# Patient Record
Sex: Female | Born: 1945 | Race: Black or African American | Hispanic: No | Marital: Single | Smoking: Never smoker
Health system: Southern US, Community
[De-identification: ages and names within clinical notes are randomized; demographics above are authoritative.]

## PROBLEM LIST (undated history)

## (undated) ENCOUNTER — Emergency Department (HOSPITAL_COMMUNITY): Payer: Self-pay | Source: Home / Self Care

## (undated) DIAGNOSIS — Z9001 Acquired absence of eye: Secondary | ICD-10-CM

## (undated) DIAGNOSIS — H547 Unspecified visual loss: Secondary | ICD-10-CM

## (undated) DIAGNOSIS — G35 Multiple sclerosis: Secondary | ICD-10-CM

## (undated) DIAGNOSIS — E785 Hyperlipidemia, unspecified: Secondary | ICD-10-CM

## (undated) HISTORY — PX: NO PAST SURGERIES: SHX2092

## (undated) HISTORY — PX: EYE SURGERY: SHX253

---

## 1965-01-10 DIAGNOSIS — H547 Unspecified visual loss: Secondary | ICD-10-CM

## 1965-01-10 DIAGNOSIS — Z9001 Acquired absence of eye: Secondary | ICD-10-CM

## 1965-01-10 HISTORY — DX: Unspecified visual loss: H54.7

## 1965-01-10 HISTORY — DX: Acquired absence of eye: Z90.01

## 2007-03-07 ENCOUNTER — Ambulatory Visit: Payer: Self-pay | Admitting: Internal Medicine

## 2008-02-01 ENCOUNTER — Ambulatory Visit: Payer: Self-pay | Admitting: Neurology

## 2008-06-30 ENCOUNTER — Ambulatory Visit: Payer: Self-pay | Admitting: Internal Medicine

## 2008-07-26 ENCOUNTER — Emergency Department: Payer: Self-pay | Admitting: Emergency Medicine

## 2010-05-07 ENCOUNTER — Ambulatory Visit: Payer: Self-pay | Admitting: Internal Medicine

## 2010-06-29 ENCOUNTER — Ambulatory Visit: Payer: Self-pay | Admitting: Internal Medicine

## 2013-05-28 ENCOUNTER — Ambulatory Visit: Payer: Self-pay

## 2015-08-05 ENCOUNTER — Other Ambulatory Visit: Payer: Self-pay | Admitting: Family Medicine

## 2015-08-05 DIAGNOSIS — Z780 Asymptomatic menopausal state: Secondary | ICD-10-CM

## 2016-06-14 ENCOUNTER — Ambulatory Visit
Admission: EM | Admit: 2016-06-14 | Discharge: 2016-06-14 | Disposition: A | Discharge status: Home / Self Care | Payer: Medicare Other | Attending: Family Medicine | Admitting: Family Medicine

## 2016-06-14 DIAGNOSIS — R234 Changes in skin texture: Secondary | ICD-10-CM

## 2016-06-14 HISTORY — DX: Hyperlipidemia, unspecified: E78.5

## 2016-06-14 NOTE — ED Provider Notes (Signed)
[facility]    CSN: [id number] Arrival date & time: 06/14/16  1915     History   Chief Complaint Chief Complaint  Patient presents with  . Mass    HPI Kelly Moss is a 71 y.o. female.   71 yo female with a c/o a "bump" to the back of the head/scalp and concerned it might be an embedded tick. Denies any injuries.   The history is provided by the patient.    Past Medical History:  Diagnosis Date  . Hyperlipidemia     There are no active problems to display for this patient.   Past Surgical History:  Procedure Laterality Date  . NO PAST SURGERIES      OB History    No data available       Home Medications    Prior to Admission medications   Medication Sig Start Date End Date Taking? Authorizing Provider  rosuvastatin (CRESTOR) 10 MG tablet Take 10 mg by mouth daily.   Yes [provider]    Family History History reviewed. No pertinent family history.  Social History Social History  Substance Use Topics  . Smoking status: Never Smoker  . Smokeless tobacco: Never Used  . Alcohol use Yes     Comment: socially     Allergies   Patient has no known allergies.   Review of Systems Review of Systems   Physical Exam Triage Vital Signs ED Triage Vitals  Enc Vitals Group     BP 06/14/16 2040 (!) 133/95     Pulse Rate 06/14/16 2040 60     Resp 06/14/16 2040 16     Temp 06/14/16 2040 98.1 F (36.7 C)     Temp Source 06/14/16 2040 Oral     SpO2 06/14/16 2040 100 %     Weight 06/14/16 2037 137 lb (62.1 kg)     Height 06/14/16 2037 5\' 4"  (1.626 m)     Head Circumference --      Peak Flow --      Pain Score --      Pain Loc --      Pain Edu? --      Excl. in GC? --    No data found.   Updated Vital Signs BP (!) 133/95 (BP Location: Left Arm)   Pulse 60   Temp 98.1 F (36.7 C) (Oral)   Resp 16   Ht 5\' 4"  (1.626 m)   Wt 137 lb (62.1 kg)   SpO2 100%   BMI 23.52 kg/m   Visual Acuity Right Eye Distance:     Left Eye Distance:   Bilateral Distance:    Right Eye Near:   Left Eye Near:    Bilateral Near:     Physical Exam  Constitutional: She appears well-developed and well-nourished. No distress.  HENT:  Head:    Occipital scalp area with 4mm scab; no drainage; no foreign body   Skin: She is not diaphoretic.  Nursing note and vitals reviewed.    UC Treatments / Results  Labs (all labs ordered are listed, but only abnormal results are displayed) Labs Reviewed - No data to display  EKG  EKG Interpretation None       Radiology No results found.  Procedures Procedures (including critical care time)  Medications Ordered in UC Medications - No data to display   Initial Impression / Assessment and Plan / UC Course  I have reviewed the triage vital signs and the  nursing notes.  Pertinent labs & imaging results that were available during my care of the patient were reviewed by me and considered in my medical decision making (see chart for details).       Final Clinical Impressions(s) / UC Diagnoses   Final diagnoses:  Scab  (on occipital scalp)  New Prescriptions Discharge Medication List as of 06/14/2016  9:06 PM     1. diagnosis reviewed with patient; scab removed      2. Follow-up prn   Payton Mccallum, MD 06/14/16 2118

## 2016-06-14 NOTE — ED Triage Notes (Signed)
Patient complains of possible tick/ bump in head. Patient states that she has been working in her garden today and noticed the area and has been concerned about a tick.

## 2016-09-01 ENCOUNTER — Other Ambulatory Visit: Payer: Self-pay | Admitting: Family Medicine

## 2016-09-01 DIAGNOSIS — Z780 Asymptomatic menopausal state: Secondary | ICD-10-CM

## 2016-12-26 ENCOUNTER — Encounter: Admission: RE | Payer: Self-pay | Source: Ambulatory Visit

## 2016-12-26 ENCOUNTER — Ambulatory Visit: Admission: RE | Admit: 2016-12-26 | Payer: Medicare Other | Source: Ambulatory Visit | Admitting: Gastroenterology

## 2016-12-26 SURGERY — COLONOSCOPY WITH PROPOFOL
Anesthesia: General

## 2017-03-01 ENCOUNTER — Encounter: Payer: Self-pay | Admitting: *Deleted

## 2017-03-02 ENCOUNTER — Encounter
Admission: RE | Disposition: A | Discharge status: Home / Self Care | Payer: Self-pay | Source: Ambulatory Visit | Attending: Gastroenterology

## 2017-03-02 ENCOUNTER — Ambulatory Visit
Admission: RE | Admit: 2017-03-02 | Discharge: 2017-03-02 | Disposition: A | Discharge status: Home / Self Care | Payer: Medicare HMO | Source: Ambulatory Visit | Attending: Gastroenterology | Admitting: Gastroenterology

## 2017-03-02 ENCOUNTER — Ambulatory Visit: Payer: Medicare HMO | Admitting: Certified Registered"

## 2017-03-02 DIAGNOSIS — R202 Paresthesia of skin: Secondary | ICD-10-CM | POA: Insufficient documentation

## 2017-03-02 DIAGNOSIS — K641 Second degree hemorrhoids: Secondary | ICD-10-CM | POA: Insufficient documentation

## 2017-03-02 DIAGNOSIS — E785 Hyperlipidemia, unspecified: Secondary | ICD-10-CM | POA: Insufficient documentation

## 2017-03-02 DIAGNOSIS — Z79899 Other long term (current) drug therapy: Secondary | ICD-10-CM | POA: Diagnosis not present

## 2017-03-02 DIAGNOSIS — G35 Multiple sclerosis: Secondary | ICD-10-CM | POA: Insufficient documentation

## 2017-03-02 DIAGNOSIS — Z1211 Encounter for screening for malignant neoplasm of colon: Secondary | ICD-10-CM | POA: Diagnosis not present

## 2017-03-02 DIAGNOSIS — Z888 Allergy status to other drugs, medicaments and biological substances status: Secondary | ICD-10-CM | POA: Insufficient documentation

## 2017-03-02 DIAGNOSIS — Z7982 Long term (current) use of aspirin: Secondary | ICD-10-CM | POA: Diagnosis not present

## 2017-03-02 DIAGNOSIS — H547 Unspecified visual loss: Secondary | ICD-10-CM | POA: Diagnosis not present

## 2017-03-02 DIAGNOSIS — Q438 Other specified congenital malformations of intestine: Secondary | ICD-10-CM | POA: Diagnosis not present

## 2017-03-02 HISTORY — PX: COLONOSCOPY WITH PROPOFOL: SHX5780

## 2017-03-02 HISTORY — DX: Multiple sclerosis: G35

## 2017-03-02 HISTORY — DX: Acquired absence of eye: Z90.01

## 2017-03-02 HISTORY — DX: Unspecified visual loss: H54.7

## 2017-03-02 SURGERY — COLONOSCOPY WITH PROPOFOL
Anesthesia: General

## 2017-03-02 MED ORDER — LIDOCAINE HCL (CARDIAC) 20 MG/ML IV SOLN
INTRAVENOUS | Status: DC | PRN
  Administered 2017-03-02: 50 mg via INTRATRACHEAL

## 2017-03-02 MED ORDER — SODIUM CHLORIDE 0.9 % IV SOLN: INTRAVENOUS | Status: DC

## 2017-03-02 MED ORDER — SODIUM CHLORIDE 0.9 % IV SOLN
INTRAVENOUS | Status: DC
  Administered 2017-03-02: 13:00:00 via INTRAVENOUS

## 2017-03-02 MED ORDER — GLYCOPYRROLATE 0.2 MG/ML IJ SOLN
INTRAMUSCULAR | Status: AC
  Filled 2017-03-02: qty 1

## 2017-03-02 MED ORDER — PROPOFOL 10 MG/ML IV BOLUS
INTRAVENOUS | Status: DC | PRN
  Administered 2017-03-02: 100 mg via INTRAVENOUS

## 2017-03-02 MED ORDER — PROPOFOL 500 MG/50ML IV EMUL
INTRAVENOUS | Status: DC | PRN
  Administered 2017-03-02: 100 ug/kg/min via INTRAVENOUS

## 2017-03-02 MED ORDER — PROPOFOL 10 MG/ML IV BOLUS
INTRAVENOUS | Status: AC
Start: 2017-03-02 — End: 2017-03-02
  Filled 2017-03-02: qty 60

## 2017-03-02 MED ORDER — LIDOCAINE HCL (PF) 2 % IJ SOLN
INTRAMUSCULAR | Status: AC
  Filled 2017-03-02: qty 10

## 2017-03-02 MED ORDER — GLYCOPYRROLATE 0.2 MG/ML IJ SOLN
INTRAMUSCULAR | Status: DC | PRN
  Administered 2017-03-02 (×2): 0.1 mg via INTRAVENOUS

## 2017-03-02 NOTE — Op Note (Addendum)
[facility] Gastroenterology Patient Name: Kelly Moss Procedure Date: 03/02/2017 1:20 PM MRN: [id number] Account #: [id number] Date of Birth: [birth year] Admit Type: Outpatient Age: 72 Room: [facility] ENDO ROOM 1 Gender: Female Note Status: Finalized Procedure:            Colonoscopy Indications:          Screening for colorectal malignant neoplasm Providers:            Christena Deem, MD Referring MD:         No Local Md, MD (Referring MD) Medicines:            Monitored Anesthesia Care Complications:        No immediate complications. Procedure:            Pre-Anesthesia Assessment:                       - ASA Grade Assessment: III - A patient with severe                        systemic disease.                       After obtaining informed consent, the colonoscope was                        passed under direct vision. Throughout the procedure,                        the patient's blood pressure, pulse, and oxygen                        saturations were monitored continuously. The                        Colonoscope was introduced through the anus and                        advanced to the the cecum, identified by appendiceal                        orifice and ileocecal valve. The colonoscopy was                        unusually difficult due to a tortuous colon. Successful                        completion of the procedure was aided by changing the                        patient to a supine position, changing the patient to a                        prone position, using manual pressure and withdrawing                        and reinserting the scope. The quality of the bowel                        preparation was good. The patient tolerated the  procedure well. The quality of the bowel preparation                        was good. Findings:      The colon (entire examined portion) was significantly redundant.      Non-bleeding  internal hemorrhoids were found during retroflexion and       during anoscopy. The hemorrhoids were small, Grade I (internal       hemorrhoids that do not prolapse) and Grade II (internal hemorrhoids       that prolapse but reduce spontaneously).      The exam was otherwise normal throughout the examined colon.      No additional abnormalities were found on retroflexion.      A few small-mouthed diverticula were found in the sigmoid colon. Impression:           - Redundant colon.                       - Non-bleeding internal hemorrhoids.                       - No specimens collected. Recommendation:       - Discharge patient to home.                       - Repeat colonoscopy in 10 years for screening purposes. Procedure Code(s):    --- Professional ---                       [phone], Colonoscopy, flexible; diagnostic, including                        collection of specimen(s) by brushing or washing, when                        performed (separate procedure) Diagnosis Code(s):    --- Professional ---                       Z12.11, Encounter for screening for malignant neoplasm                        of colon                       K64.1, Second degree hemorrhoids                       Q43.8, Other specified congenital malformations of                        intestine CPT copyright 2016 American [employer] Association. All rights reserved. The codes documented in this report are preliminary and upon coder review may  be revised to meet current compliance requirements. Christena Deem, MD 03/02/2017 1:55:43 PM This report has been signed electronically. Number of Addenda: 0 Note Initiated On: 03/02/2017 1:20 PM Scope Withdrawal Time: 0 hours 4 minutes 4 seconds  Total Procedure Duration: 0 hours 26 minutes 28 seconds       [facility]

## 2017-03-02 NOTE — Anesthesia Post-op Follow-up Note (Signed)
Anesthesia QCDR form completed.        

## 2017-03-02 NOTE — Transfer of Care (Signed)
Immediate Anesthesia Transfer of Care Note  Patient: Kelly Moss  Procedure(s) Performed: COLONOSCOPY WITH PROPOFOL (N/A )  Patient Location: PACU  Anesthesia Type:General  Level of Consciousness: drowsy and patient cooperative  Airway & Oxygen Therapy: Patient Spontanous Breathing  Post-op Assessment: Report given to RN, Post -op Vital signs reviewed and stable and Patient moving all extremities X 4  Post vital signs: Reviewed and stable  Last Vitals:  Vitals:   03/02/17 1243 03/02/17 1357  BP: 136/85 105/66  Pulse: 99 84  Resp: 16 17  Temp: (!) 36.1 C (!) 36 C  SpO2:  100%    Last Pain:  Vitals:   03/02/17 1357  TempSrc: Tympanic         Complications: No apparent anesthesia complications

## 2017-03-02 NOTE — Anesthesia Preprocedure Evaluation (Signed)
Anesthesia Evaluation  Patient identified by MRN, date of birth, ID band Patient awake    Reviewed: Allergy & Precautions, NPO status , Patient's Chart, lab work & pertinent test results  History of Anesthesia Complications Negative for: history of anesthetic complications  Airway Mallampati: II       Dental  (+) Partial Upper   Pulmonary neg sleep apnea, neg COPD,           Cardiovascular (-) hypertension(-) Past MI and (-) CHF (-) dysrhythmias (-) Valvular Problems/Murmurs     Neuro/Psych neg Seizures MS, tingling and weakness occasionally in hands and feet.    GI/Hepatic Neg liver ROS, neg GERD  ,  Endo/Other  neg diabetes  Renal/GU negative Renal ROS     Musculoskeletal   Abdominal   Peds  Hematology   Anesthesia Other Findings   Reproductive/Obstetrics                             Anesthesia Physical Anesthesia Plan  ASA: III  Anesthesia Plan: General   Post-op Pain Management:    Induction: Intravenous  PONV Risk Score and Plan: 3 and Midazolam, TIVA and Propofol infusion  Airway Management Planned: Nasal Cannula  Additional Equipment:   Intra-op Plan:   Post-operative Plan:   Informed Consent: I have reviewed the patients History and Physical, chart, labs and discussed the procedure including the risks, benefits and alternatives for the proposed anesthesia with the patient or authorized representative who has indicated his/her understanding and acceptance.     Plan Discussed with:   Anesthesia Plan Comments:         Anesthesia Quick Evaluation

## 2017-03-02 NOTE — H&P (Signed)
Outpatient short stay form Pre-procedure 03/02/2017 1:13 PM Christena Deem MD  Primary Physician: Dr. Rolin Barry  Reason for visit: Colonoscopy  History of present illness: Patient is a 72 year old female presenting today as above.  She has a history of colonoscopy done about 10 years ago.  She had no colon polyps at that time.  There is no family history of colon polyps or colon cancer.  She has no problems with diarrhea or seeing blood in the stool.  She tolerated her prep well.  She does take 81 mg aspirin daily but is held that for a day.  She takes no other aspirin products or blood thinning agents.    Current Facility-Administered Medications:  .  0.9 %  sodium chloride infusion, , Intravenous, Continuous, Christena Deem, MD .  0.9 %  sodium chloride infusion, , Intravenous, Continuous, Christena Deem, MD, Last Rate: 20 mL/hr at 03/02/17 1313 .  0.9 %  sodium chloride infusion, , Intravenous, Continuous, Christena Deem, MD  Medications Prior to Admission  Medication Sig Dispense Refill Last Dose  . aspirin EC 81 MG tablet Take 81 mg by mouth daily.   02/27/2017  . montelukast (SINGULAIR) 10 MG tablet Take 10 mg by mouth at bedtime.   Not Taking at Unknown time  . rosuvastatin (CRESTOR) 10 MG tablet Take 10 mg by mouth daily.   02/27/2017     Allergies  Allergen Reactions  . Statins      Past Medical History:  Diagnosis Date  . Blindness 1967   Blunt Trauma with Catcher's Mask in Softball  . H/O enucleation of left eyeball 1967  . Hyperlipidemia   . Multiple sclerosis (HCC)     Review of systems:      Physical Exam    Heart and lungs: Regular rate and rhythm without rub or gallop, lungs are bilaterally clear.    HEENT: Normocephalic atraumatic eyes are anicteric    Other:    Pertinant exam for procedure: Soft nontender nondistended bowel sounds positive normoactive.    Planned proceedures: Colonoscopy and indicated procedures. I have discussed  the risks benefits and complications of procedures to include not limited to bleeding, infection, perforation and the risk of sedation and the patient wishes to proceed.    Christena Deem, MD Gastroenterology 03/02/2017  1:13 PM

## 2017-03-02 NOTE — Anesthesia Postprocedure Evaluation (Signed)
Anesthesia Post Note  Patient: Kelly Moss  Procedure(s) Performed: COLONOSCOPY WITH PROPOFOL (N/A )  Patient location during evaluation: Endoscopy Anesthesia Type: General Level of consciousness: awake and alert, oriented and patient cooperative Pain management: satisfactory to patient Vital Signs Assessment: post-procedure vital signs reviewed and stable Respiratory status: spontaneous breathing and respiratory function stable Cardiovascular status: blood pressure returned to baseline and stable Postop Assessment: no backache, no headache, adequate PO intake and no apparent nausea or vomiting Anesthetic complications: no     Last Vitals:  Vitals:   03/02/17 1243 03/02/17 1357  BP: 136/85 105/66  Pulse: 99 84  Resp: 16 17  Temp: (!) 36.1 C (!) 36 C  SpO2:  100%    Last Pain:  Vitals:   03/02/17 1357  TempSrc: Tympanic                 Kelly Moss

## 2018-07-02 ENCOUNTER — Other Ambulatory Visit: Payer: Self-pay | Admitting: Neurology

## 2018-07-02 DIAGNOSIS — G35 Multiple sclerosis: Secondary | ICD-10-CM

## 2018-07-19 ENCOUNTER — Other Ambulatory Visit: Payer: Self-pay

## 2018-07-19 ENCOUNTER — Ambulatory Visit
Admission: RE | Admit: 2018-07-19 | Discharge: 2018-07-19 | Disposition: A | Discharge status: Home / Self Care | Payer: Medicare HMO | Source: Ambulatory Visit | Attending: Neurology | Admitting: Neurology

## 2018-07-19 DIAGNOSIS — G35 Multiple sclerosis: Secondary | ICD-10-CM

## 2018-07-19 MED ORDER — GADOBUTROL 1 MMOL/ML IV SOLN
6.0000 mL | Freq: Once | INTRAVENOUS | Status: AC | PRN
  Administered 2018-07-19: 6 mL via INTRAVENOUS

## 2019-08-13 ENCOUNTER — Other Ambulatory Visit: Payer: Self-pay | Admitting: Family Medicine

## 2019-08-13 DIAGNOSIS — Z780 Asymptomatic menopausal state: Secondary | ICD-10-CM

## 2020-12-07 IMAGING — MR MRI HEAD WITHOUT AND WITH CONTRAST
12 of 14 series · 37 of 48 positions shown · IV contrast (gadavist)
Comparison: Comparison made with previous brain MRI from
02/01/2008.

CLINICAL DATA: Initial evaluation for multiple sclerosis.
Headaches, dizziness, imbalance.

EXAM:
MRI HEAD WITHOUT AND WITH CONTRAST
MRI CERVICAL SPINE WITHOUT AND WITH CONTRAST
TECHNIQUE: Multiplanar, multiecho pulse sequences of the brain and surrounding
structures, and cervical spine, to include the craniocervical
junction and cervicothoracic junction, were obtained without and
with intravenous contrast.
CONTRAST:  6 cc of Gadavist.

[Series 5: ax dwi_tracew · axial · 3.0mm · 0.60mm/px · z∈[-115,+38]mm · 4 of 48 slices shown]
[im 1/48]
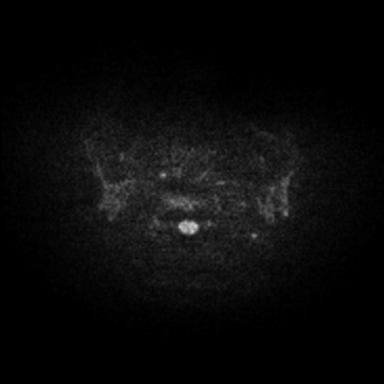
[im 16/48]
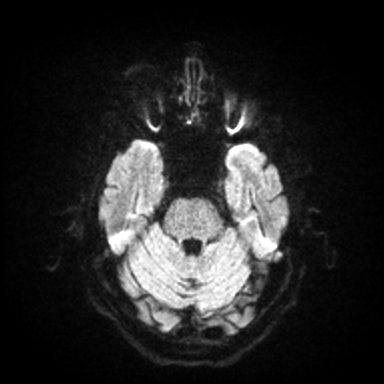
[im 32/48]
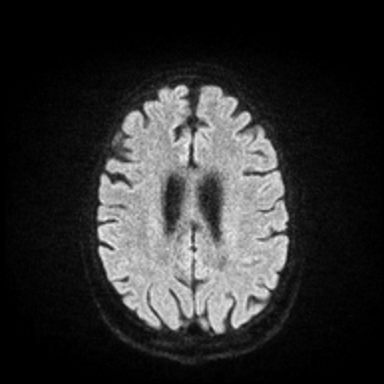
[im 48/48]
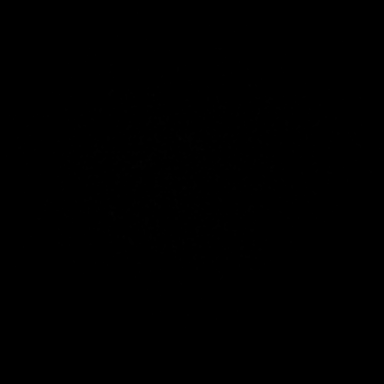

[Series 6: ax dwi_adc · axial · 3.0mm · 0.60mm/px · z∈[-115,+34]mm · 3 of 47 slices shown]
[im 1/47]
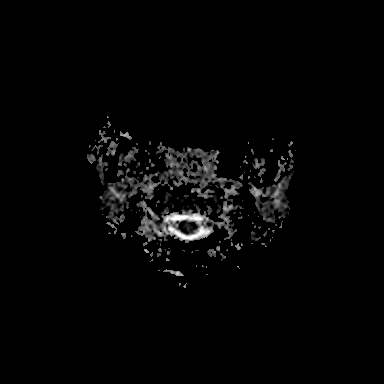
[im 24/47]
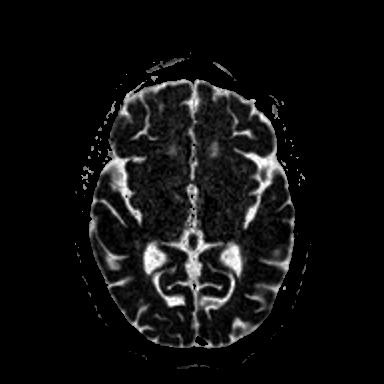
[im 47/47]
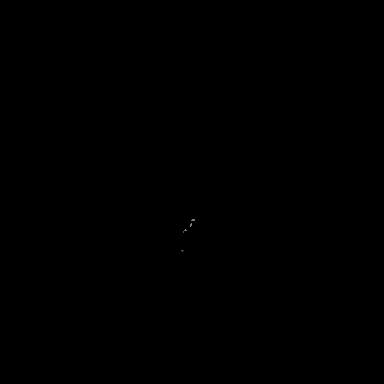

[Series 7: cor dwi_tracew · coronal · 5.0mm · 0.60mm/px · 2 of 38 slices shown]
[im 1/38]
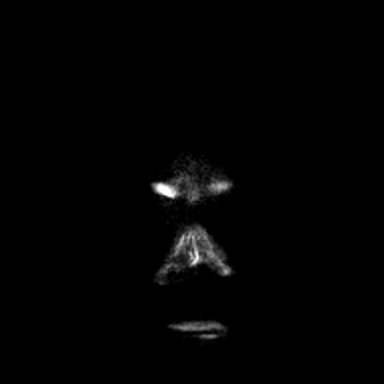
[im 38/38]
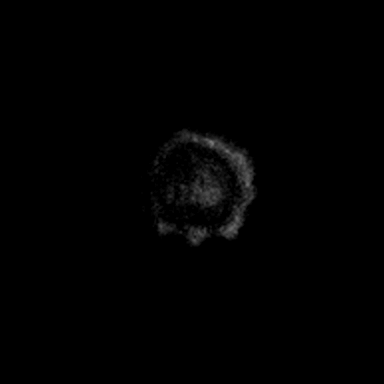

[Series 8: cor dwi_adc · coronal · 5.0mm · 0.60mm/px · 2 of 37 slices shown]
[im 1/37]
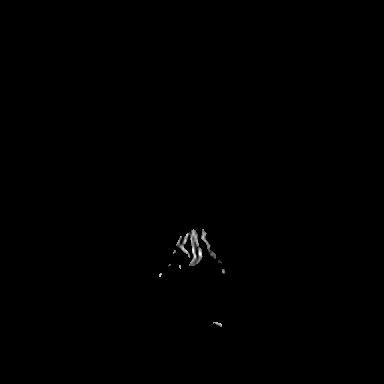
[im 37/37]
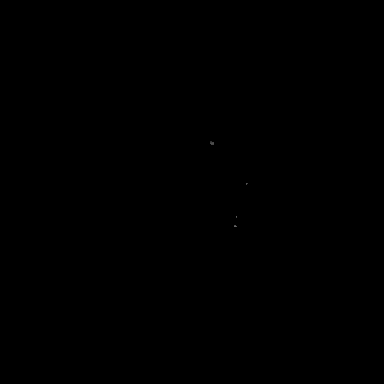

[Series 9: T1 · sagittal · 5.0mm · 0.62mm/px · 1 of 21 slices shown (1 of 2)]
[im 1/21]
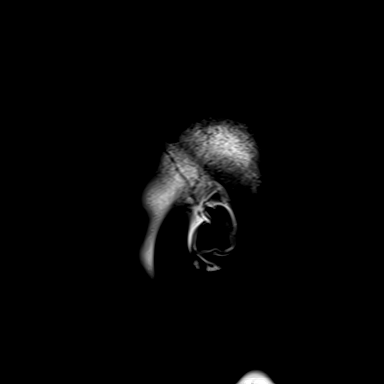

[Series 10: FLAIR · sagittal · 5.0mm · 0.94mm/px · 1 of 21 slices shown (1 of 2)]
[im 1/21]
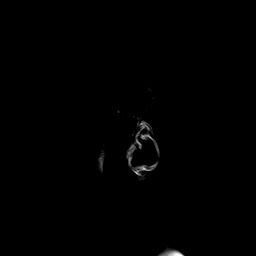

[Series 11: T2 · axial · 5.0mm · 0.53mm/px · 1 of 25 slices shown]
[im 1/25]
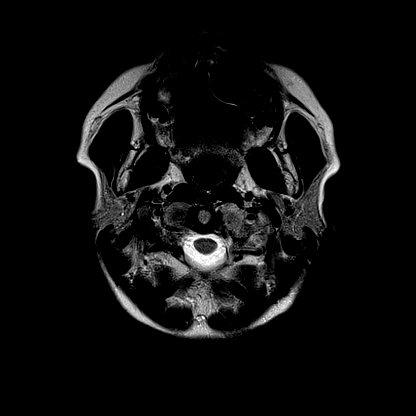

[Series 16: FLAIR · axial · 3.0mm · 0.53mm/px · z∈[-119,+40]mm · 3 of 55 slices shown (2 of 2)]
[im 1/55]
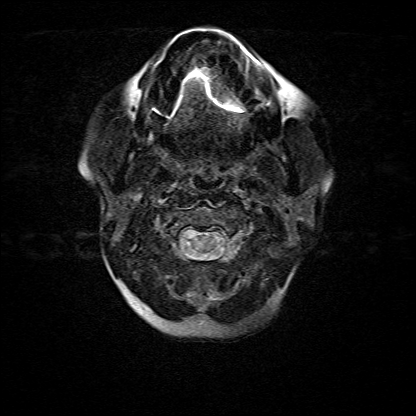
[im 28/55]
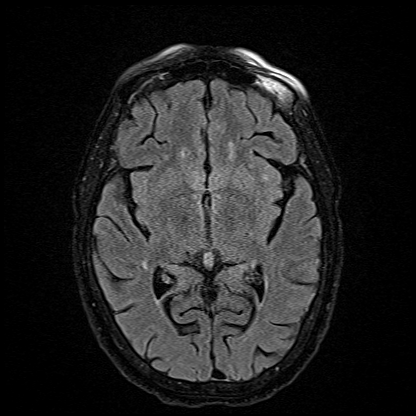
[im 55/55]
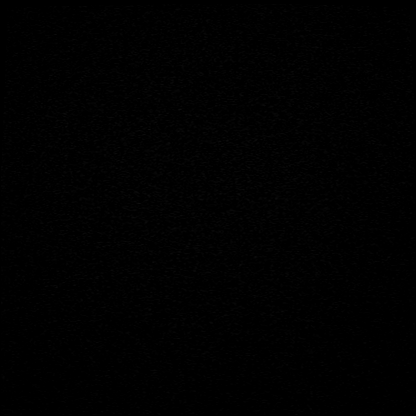

[Series 17: T1 · axial · 1.0mm · 0.98mm/px · z∈[-128,+43]mm · 8 of 176 slices shown (2 of 2)]
[im 1/176]
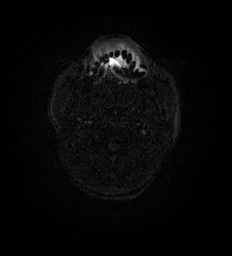
[im 20/176]
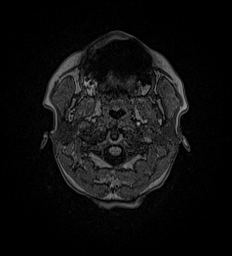
[im 59/176]
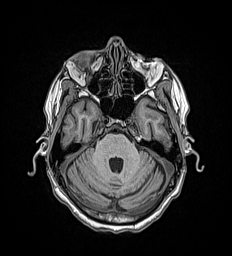
[im 78/176]
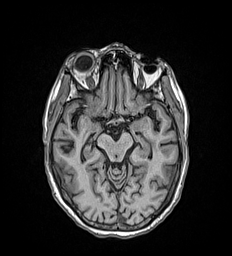
[im 98/176]
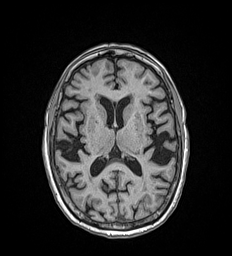
[im 117/176]
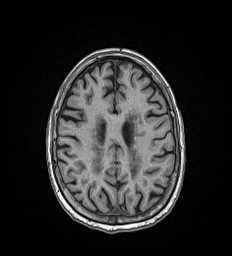
[im 156/176]
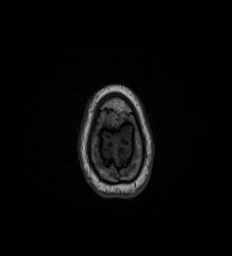
[im 176/176]
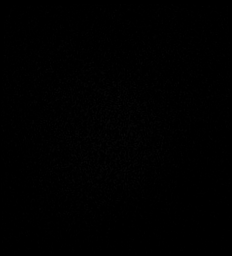

[Series 18: T2 post-contrast · coronal · 5.0mm · 0.57mm/px · 2 of 29 slices shown]
[im 1/29]
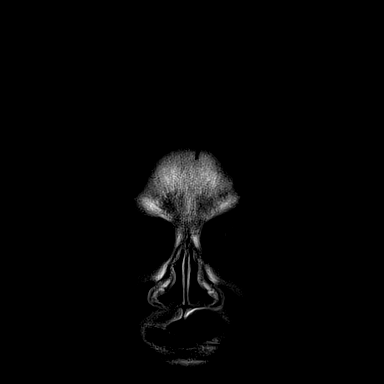
[im 29/29]
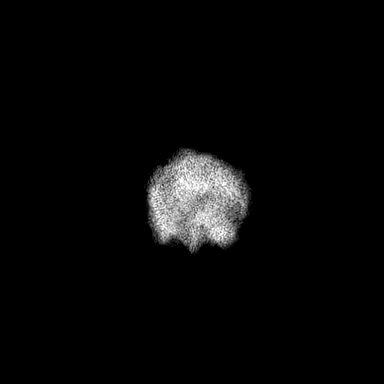

[Series 19: T1 post-contrast · axial · 1.0mm · 0.98mm/px · z∈[-128,+42]mm · 8 of 175 slices shown (1 of 2)]
[im 1/175]
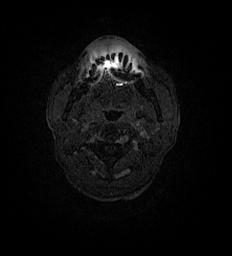
[im 20/175]
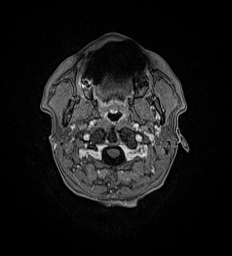
[im 59/175]
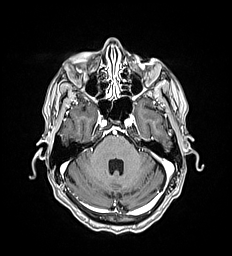
[im 78/175]
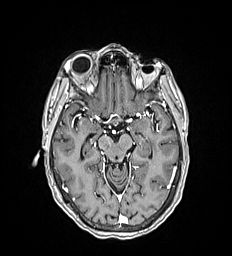
[im 97/175]
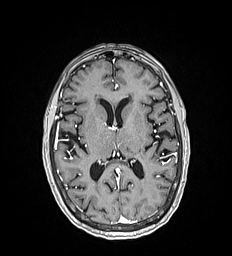
[im 117/175]
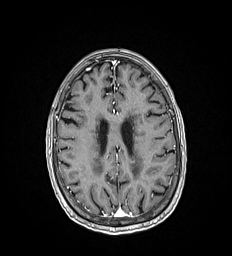
[im 155/175]
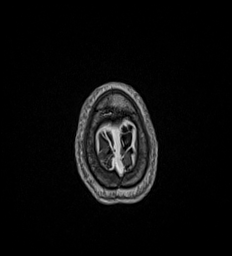
[im 175/175]
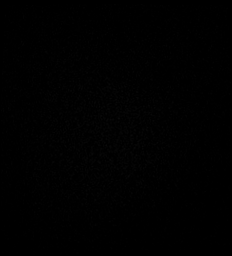

[Series 20: T1 post-contrast · coronal · 5.0mm · 0.57mm/px · 2 of 29 slices shown (2 of 2)]
[im 1/29]
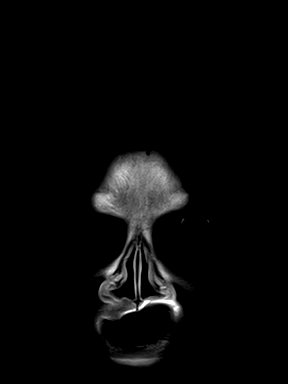
[im 29/29]
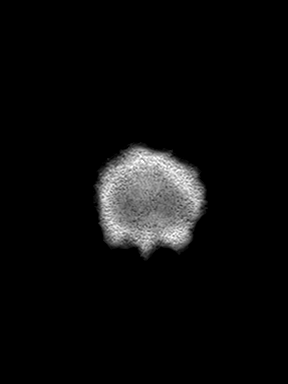

[37 of 48 positions shown; findings below may reference images not displayed]

FINDINGS: MRI HEAD FINDINGS

Brain: Cerebral volume within normal limits for age. Patchy and
confluent T2/FLAIR signal abnormality involving predominantly the
periventricular white matter is seen, with a few additional
scattered subcentimeter hyperintensities within the adjacent deep
and subcortical white matter. Relative sparing of the brainstem and
cerebellum. Several of these foci are oriented perpendicular to the
lateral ventricles in a distribution consistent with history of
multiple sclerosis. Overall, changes are mildly progressed relative
to 3505. No associated restricted diffusion or abnormal enhancement
to suggest active demyelination.

No evidence for acute or subacute ischemia. Gray-white matter
differentiation maintained. No encephalomalacia to suggest chronic
cortical infarction. Possible superimposed remote lacunar infarcts
noted at the thalami bilaterally. No evidence for acute or chronic
intracranial hemorrhage.

No mass lesion, midline shift or mass effect. No hydrocephalus. No
extra-axial fluid collection. Pituitary gland within normal limits.

Vascular: Major intracranial vascular flow voids are maintained.

Skull and upper cervical spine: Craniocervical junction normal. Bone
marrow signal intensity within normal limits. No scalp soft tissue
abnormality.

Sinuses/Orbits: Left ocular prosthesis in place. Globes and orbital
soft tissues demonstrate no acute finding. Paranasal sinuses are
clear. No significant mastoid effusion. Inner ear structures normal.

Other: None.

MRI CERVICAL SPINE FINDINGS

Alignment: Trace retrolisthesis of C4 on C5, chronic and
degenerative. Alignment otherwise normal with preservation of the
normal cervical lordosis.

Vertebrae: Vertebral body height maintained without evidence for
acute or chronic fracture. Bone marrow signal intensity within
normal limits. Subcentimeter benign hemangioma noted within the T3
vertebral body. No other discrete or worrisome osseous lesions.
Minimal reactive endplate changes noted about the C4-5 and C5-6
interspaces. No other abnormal marrow edema or enhancement.

Cord: Subtle patchy signal abnormality within the right hemi cord at
the level of C2 (series 10, image 1). Additional possible patchy
signal abnormality within the central dorsal cord at the level of C3
(series 10, image 7 no other definite cord signal abnormality
identified. Findings suggestive of demyelinating disease. No
abnormal enhancement to suggest active demyelination. Underlying
cord caliber and morphology within normal limits.).

Posterior Fossa, vertebral arteries, paraspinal tissues:
Craniocervical junction normal. Paraspinous and prevertebral soft
tissues within normal limits. Normal intravascular flow voids seen
within the vertebral arteries bilaterally.

Disc levels:

C2-C3: Unremarkable.

C3-C4: Broad-based posterior disc osteophyte mildly indents and
flattens the ventral thecal sac. Mild spinal stenosis without cord
deformity. Foramina remain widely patent.

C4-C5: Trace retrolisthesis. Diffuse degenerative disc osteophyte
with intervertebral disc space narrowing. Left greater than right
uncinate spurring with mild facet hypertrophy. Mild spinal stenosis.
Severe left with moderate right C5 foraminal stenosis.

C5-C6: Circumferential disc osteophyte with intervertebral disc
space narrowing. Broad posterior component flattens and partially
faces the ventral thecal sac. No significant spinal stenosis. Severe
bilateral C6 foraminal stenosis.

C6-C7: Diffuse disc bulge with bilateral uncinate spurring. Bulging
disc flattens and partially effaces the ventral thecal sac without
significant stenosis or cord deformity. Severe left with moderate
right C7 foraminal stenosis.

C7-T1:  Unremarkable.

Visualized upper thoracic spine within normal limits.
IMPRESSION: MRI HEAD IMPRESSION:

1. T2/FLAIR signal abnormality involving predominantly the
periventricular white matter of both cerebral hemispheres,
consistent with provided history of multiple sclerosis. Overall,
changes are mildly progressed relative to 3505. No evidence for
active demyelination.
2. No other acute intracranial abnormality identified.

MRI CERVICAL SPINE IMPRESSION:

1. Subtle patchy cord signal abnormality at the level of C2 and C3
as above, consistent with demyelinating disease. No evidence for
active demyelination.
2. Mild-to-moderate multilevel cervical spondylolysis with resultant
mild spinal stenosis at C3-4 and C4-5.
3. Multifactorial degenerative changes with resultant multilevel
foraminal narrowing, with notable findings including severe left
with moderate right C5 foraminal stenosis, severe bilateral C6
foraminal narrowing, with severe left and moderate right C7
foraminal stenosis.

## 2022-03-01 ENCOUNTER — Ambulatory Visit
Admission: RE | Admit: 2022-03-01 | Discharge: 2022-03-01 | Disposition: A | Discharge status: Home / Self Care | Payer: Medicare HMO | Attending: Family Medicine | Admitting: Family Medicine

## 2022-03-01 ENCOUNTER — Ambulatory Visit
Admission: RE | Admit: 2022-03-01 | Discharge: 2022-03-01 | Disposition: A | Discharge status: Home / Self Care | Payer: Medicare HMO | Source: Ambulatory Visit | Attending: Family Medicine | Admitting: Family Medicine

## 2022-03-01 ENCOUNTER — Other Ambulatory Visit: Payer: Self-pay

## 2022-03-01 DIAGNOSIS — R053 Chronic cough: Secondary | ICD-10-CM

## 2022-07-20 ENCOUNTER — Other Ambulatory Visit: Payer: Self-pay | Admitting: Neurology

## 2022-07-20 DIAGNOSIS — G35 Multiple sclerosis: Secondary | ICD-10-CM

## 2022-07-25 ENCOUNTER — Encounter: Payer: Self-pay | Admitting: Neurology

## 2022-08-08 ENCOUNTER — Other Ambulatory Visit: Payer: Self-pay | Admitting: Neurology

## 2022-08-08 ENCOUNTER — Ambulatory Visit
Admission: RE | Admit: 2022-08-08 | Discharge: 2022-08-08 | Disposition: A | Discharge status: Home / Self Care | Payer: Medicare HMO | Source: Ambulatory Visit | Attending: Neurology | Admitting: Neurology

## 2022-08-08 DIAGNOSIS — G35 Multiple sclerosis: Secondary | ICD-10-CM

## 2023-03-07 ENCOUNTER — Ambulatory Visit: Payer: Self-pay

## 2023-03-07 DIAGNOSIS — Z719 Counseling, unspecified: Secondary | ICD-10-CM

## 2023-03-07 DIAGNOSIS — Z23 Encounter for immunization: Secondary | ICD-10-CM | POA: Diagnosis not present

## 2023-03-07 NOTE — Progress Notes (Signed)
 Pt in nurse clinic requesting Pf[employer] Covid vaccine. Eligible per [facility], given VIS, administered, monitored for 15 minutes without problems. Given [facility] copy, explained and understood. M.Shamere Campas, LPN.

## 2023-09-17 ENCOUNTER — Ambulatory Visit
Admission: EM | Admit: 2023-09-17 | Discharge: 2023-09-17 | Disposition: A | Discharge status: Home / Self Care | Attending: Emergency Medicine | Admitting: Emergency Medicine

## 2023-09-17 ENCOUNTER — Encounter: Payer: Self-pay | Admitting: Emergency Medicine

## 2023-09-17 DIAGNOSIS — R059 Cough, unspecified: Secondary | ICD-10-CM | POA: Diagnosis not present

## 2023-09-17 DIAGNOSIS — G35 Multiple sclerosis: Secondary | ICD-10-CM | POA: Diagnosis not present

## 2023-09-17 DIAGNOSIS — E785 Hyperlipidemia, unspecified: Secondary | ICD-10-CM | POA: Insufficient documentation

## 2023-09-17 DIAGNOSIS — Z7982 Long term (current) use of aspirin: Secondary | ICD-10-CM | POA: Insufficient documentation

## 2023-09-17 DIAGNOSIS — Z79899 Other long term (current) drug therapy: Secondary | ICD-10-CM | POA: Insufficient documentation

## 2023-09-17 DIAGNOSIS — U071 COVID-19: Secondary | ICD-10-CM | POA: Insufficient documentation

## 2023-09-17 DIAGNOSIS — R0989 Other specified symptoms and signs involving the circulatory and respiratory systems: Secondary | ICD-10-CM | POA: Insufficient documentation

## 2023-09-17 LAB — SARS CORONAVIRUS 2 BY RT PCR: SARS Coronavirus 2 by RT PCR: POSITIVE — AB

## 2023-09-17 NOTE — ED Provider Notes (Signed)
 [facility]    CSN: [id number] Arrival date & time: 09/17/23  1253      History   Chief Complaint Chief Complaint  Patient presents with   Cough    HPI Jmya Uliano is a 78 y.o. female.   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  She reports that she is now starting to cough up some frothy looking sputum.  Last week she was around a close contact who tested positive for COVID.  She is here requesting COVID testing.  She denies any fever, runny nose, ear pain, sore throat, shortness breath, or wheezing.  Past Medical History:  Diagnosis Date   Blindness 1967   Blunt Trauma with Catcher's Mask in Softball   H/O enucleation of left eyeball 1967   Hyperlipidemia    Multiple sclerosis (HCC)     There are no active problems to display for this patient.   Past Surgical History:  Procedure Laterality Date   COLONOSCOPY WITH PROPOFOL  N/A 03/02/2017   Procedure: COLONOSCOPY WITH PROPOFOL ;  Surgeon: Gaylyn Gladis PENNER, MD;  Location: [facility] ENDOSCOPY;  Service: Endoscopy;  Laterality: N/A;   EYE SURGERY     Orbital Eye Surgery s/p Enucleation LT Eye   NO PAST SURGERIES      OB History   No obstetric history on file.      Home Medications    Prior to Admission medications   Medication Sig Start Date End Date Taking? Authorizing Provider  alendronate (FOSAMAX) 70 MG tablet Take 70 mg by mouth. 09/28/22 09/28/23 Yes [provider]  aspirin EC 81 MG tablet Take 81 mg by mouth daily.    [provider]  calcium carbonate (OSCAL) 1500 (600 Ca) MG TABS tablet Take 600 mg by mouth.    [provider]  cyanocobalamin (VITAMIN B12) 1000 MCG tablet Take 1,000 mcg by mouth.    [provider]  montelukast (SINGULAIR) 10 MG tablet Take 10 mg  by mouth at bedtime.    [provider]  rosuvastatin (CRESTOR) 10 MG tablet Take 10 mg by mouth daily.    [provider]    Family History Family History  Problem Relation Age of Onset   Breast cancer Mother    Aortic aneurysm Mother    High blood pressure Mother    Breast cancer Sister    Heart attack Sister    Hypercholesterolemia Sister    COPD Brother    High blood pressure Daughter     Social History Social History   Tobacco Use   Smoking status: Never   Smokeless tobacco: Never  Vaping Use   Vaping status: Never Used  Substance Use Topics   Alcohol use: Yes    Comment: socially   Drug use: No     Allergies   Statins   Review of Systems Review of Systems  Constitutional:  Negative for fever.  HENT:  Positive for congestion. Negative for ear pain, rhinorrhea and sore throat.   Respiratory:  Positive for cough. Negative for shortness of breath and wheezing.      Physical Exam Triage Vital Signs ED Triage Vitals  Encounter Vitals Group     BP      Girls Systolic BP Percentile      Girls Diastolic BP Percentile  Boys Systolic BP Percentile      Boys Diastolic BP Percentile      Pulse      Resp      Temp      Temp src      SpO2      Weight      Height      Head Circumference      Peak Flow      Pain Score      Pain Loc      Pain Education      Exclude from Growth Chart    No data found.  Updated Vital Signs BP 139/82 (BP Location: Left Arm)   Pulse 69   Temp 98.3 F (36.8 C) (Oral)   Resp 14   Ht 5' 4 (1.626 m)   Wt 136 lb 14.5 oz (62.1 kg)   SpO2 96%   BMI 23.50 kg/m   Visual Acuity Right Eye Distance:   Left Eye Distance:   Bilateral Distance:    Right Eye Near:   Left Eye Near:    Bilateral Near:     Physical Exam Vitals and nursing note reviewed.  Constitutional:      Appearance: Normal appearance. She is not ill-appearing.  HENT:     Head: Normocephalic and atraumatic.     Right Ear: Tympanic  membrane, ear canal and external ear normal. There is no impacted cerumen.     Left Ear: Tympanic membrane, ear canal and external ear normal. There is no impacted cerumen.     Nose: Congestion present. No rhinorrhea.     Comments: Nasal mucosa is mildly edematous but free of erythema.  No appreciable discharge.    Mouth/Throat:     Mouth: Mucous membranes are moist.     Pharynx: Oropharynx is clear. No oropharyngeal exudate or posterior oropharyngeal erythema.  Cardiovascular:     Rate and Rhythm: Normal rate and regular rhythm.     Pulses: Normal pulses.     Heart sounds: Normal heart sounds. No murmur heard.    No friction rub. No gallop.  Pulmonary:     Effort: Pulmonary effort is normal.     Breath sounds: Normal breath sounds. No wheezing, rhonchi or rales.  Musculoskeletal:     Cervical back: Normal range of motion and neck supple. No tenderness.  Lymphadenopathy:     Cervical: No cervical adenopathy.  Skin:    General: Skin is warm and dry.     Capillary Refill: Capillary refill takes less than 2 seconds.     Findings: No rash.  Neurological:     General: No focal deficit present.     Mental Status: She is alert and oriented to person, place, and time.      UC Treatments / Results  Labs (all labs ordered are listed, but only abnormal results are displayed) Labs Reviewed  SARS CORONAVIRUS 2 BY RT PCR - Abnormal; Notable for the following components:      Result Value   SARS Coronavirus 2 by RT PCR POSITIVE (*)    All other components within normal limits    EKG   Radiology No results found.  Procedures Procedures (including critical care time)  Medications Ordered in UC Medications - No data to display  Initial Impression / Assessment and Plan / UC Course  I have reviewed the triage vital signs and the nursing notes.  Pertinent labs & imaging results that were available during my care of the patient were reviewed  by me and considered in my medical  decision making (see chart for details).   Patient is a nontoxic-appearing 78 year old female presenting with request for COVID testing.  She is here at the behest of her family.  She reports that she really is not feeling poorly but she is experiencing a cough that has become productive for a frothy sputum without colored.  No shortness of breath or wheezing.  She does endorse some mild nasal congestion but no other upper respiratory symptoms and no fever.  She was exposed to a close contact approximately 8 days ago who tested positive for COVID.  Her physical exam reveals mild inflammation of her nasal mucosa without appreciable nasal discharge.  The remainder of her upper respiratory tract is benign.  Her lungs are clear to auscultation in all fields.  We discussed that the patient is still in the window for antivirals given that she has MS should she test positive for COVID.  She is unsure at this time if she wants to pursue antiviral therapy.  We will perform a COVID PCR to evaluate for the presence of COVID.  COVID PCR is positive.  I discussed the fact that the patient is COVID-positive with her and she is not interested in pursuing the Paxlovid at this time.  She states she will continue to use the over-the-counter medication that she has been using and wearing her mask.  If any further complications arise she will follow-up with her PCP.   Final Clinical Impressions(s) / UC Diagnoses   Final diagnoses:  COVID-19     Discharge Instructions      CDC guidelines state that you must wear a mask for the first 5 days of symptoms when you are around other people.  After 5 days you no longer need to mask as you are no longer considered infectious.  There is no longer need to quarantine unless you have a fever.  If you do have a fever then you need to quarantine until you have been fever free for 24 hours without taking Tylenol and/or ibuprofen.  Use over-the-counter Tylenol and/or ibuprofen  according to the package instructions as needed for fever and pain.  Continue to use the over-the-counter cold medication that you have been using to help with your symptoms.  If you develop any worsening respiratory symptoms such as shortness of breath, shortness of breath at rest, feel as though you cannot catch your breath, you are unable to speak in full sentences, or, as a late sign, your lips begin turning blue you need to call 911 and go to the ER for evaluation.      ED Prescriptions   None    PDMP not reviewed this encounter.   Bernardino Ditch, NP 09/17/23 1429

## 2023-09-17 NOTE — ED Triage Notes (Signed)
 Patient c/o cough and chest congestion off and on for several days.  Patient states that today she has been coughing up yellow sputum.  Patient states that was she was around her friend over a week ago and later tested positive for COVID.  Patient denies fevers.

## 2023-09-17 NOTE — Discharge Instructions (Addendum)
 CDC guidelines state that you must wear a mask for the first 5 days of symptoms when you are around other people.  After 5 days you no longer need to mask as you are no longer considered infectious.  There is no longer need to quarantine unless you have a fever.  If you do have a fever then you need to quarantine until you have been fever free for 24 hours without taking Tylenol and/or ibuprofen.  Use over-the-counter Tylenol and/or ibuprofen according to the package instructions as needed for fever and pain.  Continue to use the over-the-counter cold medication that you have been using to help with your symptoms.  If you develop any worsening respiratory symptoms such as shortness of breath, shortness of breath at rest, feel as though you cannot catch your breath, you are unable to speak in full sentences, or, as a late sign, your lips begin turning blue you need to call 911 and go to the ER for evaluation.
